# Patient Record
Sex: Male | Born: 1950 | Race: White | Hispanic: No | Marital: Married | Smoking: Former smoker
Health system: Southern US, Community
[De-identification: ages and names within clinical notes are randomized; demographics above are authoritative.]

## PROBLEM LIST (undated history)

## (undated) DIAGNOSIS — I10 Essential (primary) hypertension: Secondary | ICD-10-CM

---

## 2005-04-19 ENCOUNTER — Ambulatory Visit: Payer: Self-pay | Admitting: Family Medicine

## 2005-05-21 ENCOUNTER — Ambulatory Visit: Payer: Self-pay | Admitting: Family Medicine

## 2005-06-19 ENCOUNTER — Ambulatory Visit: Payer: Self-pay | Admitting: Family Medicine

## 2005-07-17 ENCOUNTER — Ambulatory Visit: Payer: Self-pay | Admitting: Family Medicine

## 2013-07-17 ENCOUNTER — Emergency Department (HOSPITAL_COMMUNITY)
Admission: EM | Admit: 2013-07-17 | Discharge: 2013-07-17 | Disposition: A | Discharge status: Home / Self Care | Payer: 59 | Attending: Emergency Medicine | Admitting: Emergency Medicine

## 2013-07-17 ENCOUNTER — Emergency Department (HOSPITAL_COMMUNITY): Payer: 59

## 2013-07-17 ENCOUNTER — Encounter (HOSPITAL_COMMUNITY): Payer: Self-pay | Admitting: Emergency Medicine

## 2013-07-17 DIAGNOSIS — R404 Transient alteration of awareness: Secondary | ICD-10-CM | POA: Insufficient documentation

## 2013-07-17 DIAGNOSIS — R911 Solitary pulmonary nodule: Secondary | ICD-10-CM

## 2013-07-17 DIAGNOSIS — I10 Essential (primary) hypertension: Secondary | ICD-10-CM | POA: Insufficient documentation

## 2013-07-17 DIAGNOSIS — R0989 Other specified symptoms and signs involving the circulatory and respiratory systems: Secondary | ICD-10-CM

## 2013-07-17 DIAGNOSIS — R6889 Other general symptoms and signs: Secondary | ICD-10-CM | POA: Insufficient documentation

## 2013-07-17 DIAGNOSIS — Z8546 Personal history of malignant neoplasm of prostate: Secondary | ICD-10-CM | POA: Insufficient documentation

## 2013-07-17 DIAGNOSIS — Z87891 Personal history of nicotine dependence: Secondary | ICD-10-CM | POA: Insufficient documentation

## 2013-07-17 DIAGNOSIS — Z7982 Long term (current) use of aspirin: Secondary | ICD-10-CM | POA: Insufficient documentation

## 2013-07-17 HISTORY — DX: Essential (primary) hypertension: I10

## 2013-07-17 NOTE — Discharge Instructions (Signed)
Please follow up with your primary care doctor to discuss your visit to the ER and to have your CT scan of your chest scheduled as an outpatient procedure for further evaluation of your pulmonary nodule. Please followup with the gastroenterologist in [place] to discuss your visit to the ER this evening as well and to discuss possible endoscopy for recurrent choking episodes. Please read all discharge instructions and return precautions.    Choking, Adult Choking occurs when a food or object gets stuck in the throat or trachea, blocking the airway. If the airway is partly blocked, coughing will usually cause the food or object to come out. If the airway is completely blocked, immediate action is needed to help it come out. A complete airway blockage is life-threatening because it causes breathing to stop. Choking is a true medical emergency that requires fast, appropriate action by anyone available. SIGNS OF AIRWAY BLOCKAGE There is a partial airway blockage if you or the person who is choking is:   Able to breathe and speak.  Coughing loudly.  Making loud noises. There is a complete airway blockage if you or the person who is choking is:   Unable to breathe.  Making soft or high-pitched sounds while breathing.  Unable to cough or coughing weakly, ineffectively, or silently.  Unable to cry, speak, or make sounds.  Turning blue.  Holding the neck with both arms. This is the universal sign of choking. WHAT TO DO IF CHOKING OCCURS If there is a partial airway blockage, allow coughing to clear the airway. Do not try to drink until the food or object comes out. If someone else has a partial airway blockage, do not interfere. Stay with him or her and watch for signs of complete airway blockage until the food or object comes out.  If there is a complete airway blockage or if there is a partial airway blockage and the food or object does not come out, perform abdominal thrusts (also referred to as  the Heimlich maneuver). Abdominal thrusts are used to create an artificial cough to try to clear the airway. Performing abdominal thrusts is part of a series of steps that should be done to help someone who is choking. Abdominal thrusts are usually done by someone else, but if you are alone, you can perform abdominal thrusts on yourself. Follow the procedure below that best fits your situation.  IF SOMEONE ELSE IS CHOKING: For a conscious adult:  1. Ask the person whether he or she is choking. If the person nods, continue to step 2. 2. Stand or kneel behind the person and lean him or her forward slightly. 3. Make a fist with 1 hand, put your arms around the person, and grasp your fist with your other hand. Place the thumb side of your fist in the person's abdomen, just below the ribs. 4. Press inward and upward with both hands. 5. Repeat this maneuver until the object comes out and the person is able to breathe or until the person loses consciousness. For an unconcious adult: 1. Shout for help. If someone responds, have him or her call local emergency services (911 in [place] If no one responds, call local emergency services yourself if possible. 2. Begin CPR, starting with compressions. Every time you open the airway to give rescue breaths, open the person's mouth. If you can see the food or object and it can be easily pulled out, remove it with your fingers. 3. After 5 cycles or 2 minutes of CPR,  call local emergency services (911 in [place] if you or someone else did not already call. For a conscious adult who is obese or in the later stages of pregnancy: Abdominal thrusts may not be effective when helping people who are in the later stages of pregnancy or who are obese. In these instances, chest thrusts can be used.  1. Ask the person whether he or she is choking. If the person nods and has signs of complete airway blockage, continue to step 2. 2. Stand behind the person and wrap your arms around his  or her chest (with your arms under the person's armpits). 3. Make a fist with 1 hand. Place the thumb side of your fist on the middle of the person's breastbone. 4. Grab your fist with your other hand and thrust backward. Continue this until the object comes out or until the person becomes unconscious. For an unconscious adult who is obese or in the later stages of pregnancy:  1. Shout for help. If someone responds, have him or her call local emergency services (911 in [place] If no one responds, call local emergency services yourself if possible. 2. Begin CPR, starting with compressions. Every time you open the airway to give rescue breaths, open the person's mouth. If you can see the food or object and it can be easily pulled out, remove it with your fingers. 3. After 5 cycles or 2 minutes of CPR, call local emergency services (911 in [place] if you or someone else did not already call. Note that abdominal thrusts (below the rib cage) should be used for a pregnant woman if possible. This should be possible until the later stages of pregnancy when there is no longer enough room between the enlarging uterus and the rib cage to perform the maneuver. At that point, chest thrusts must be used as described. IF YOU ARE CHOKING: 1. Call local emergency services (911 in [place] if near a landline. Do not worry about communicating what is happening. Do not hang up the phone. Someone may be sent to help you anyway. 2. Make a fist with 1 hand. Put the thumb side of the fist against your stomach, just above the belly button and well below the breastbone. If you are pregnant or obese, put your fist on your chest instead, just below the breastbone and just above your lowest ribs. 3. Hold your fist with your other hand and bend over a hard surface, such as a table or chair. 4. Forcefully push your fist in and up. 5. Continue to do this until the food or object comes out. PREVENTION  To be prepared if choking occurs,  learn how to correctly perform abdominal thrusts and give CPR by taking a certified first-aid training course.  SEEK IMMEDIATE MEDICAL CARE IF:  You have a fever after choking stops.  You have problems breathing after choking stops.  You received the Heimlich maneuver. MAKE SURE YOU:  Understand these instructions.  Watch your condition.  Get help right away if you are not doing well or get worse. Document Released: 05/24/2004 Document Revised: 01/09/2012 Document Reviewed: 11/27/2011 [facility] Patient Information 2014 [place]. Pulmonary Nodule A pulmonary nodule is a small, round growth of tissue in the lung. Pulmonary nodules can range in size from less than 1/5 inch (4 mm) to a little bigger than an inch (25 mm). Most pulmonary nodules are detected when imaging tests of the lung are being performed for a different problem. Pulmonary nodules are usually not cancerous (  benign). However, some pulmonary nodules are cancerous (malignant). Follow-up treatment or testing is based on the size of the pulmonary nodule and your risk of getting lung cancer.  CAUSES Benign pulmonary nodules can be caused by various things. Some of the causes include:   Bacterial, fungal, or viral infections. This is usually an old infection that is no longer active, but it can sometimes be a current, active infection.  A benign mass of tissue.  Inflammation from conditions such as rheumatoid arthritis.   Abnormal blood vessels in the lungs. Malignant pulmonary nodules can result from lung cancer or from cancers that spread to the lung from other places in the body. SIGNS AND SYMPTOMS Pulmonary nodules usually do not cause symptoms. DIAGNOSIS Most often, pulmonary nodules are found incidentally when an X-ray or CT scan is performed to look for some other problem in the lung area. To help determine whether a pulmonary nodule is benign or malignant, your health care provider will take a medical history  and order a variety of tests. Tests done may include:   Blood tests.  A skin test called a tuberculin test. This test is used to determine if you have been exposed to the germ that causes tuberculosis.   Chest X-rays. If possible, a new X-ray may be compared with X-rays you have had in the past.   CT scan. This test shows smaller pulmonary nodules more clearly than an X-ray.   Positron emission tomography (PET) scan. In this test, a safe amount of a radioactive substance is injected into the bloodstream. Then, the scan takes a picture of the pulmonary nodule. The radioactive substance is eliminated from your body in your urine.   Biopsy. A tiny piece of the pulmonary nodule is removed so it can be checked under a microscope. TREATMENT  Pulmonary nodules that are benign normally do not require any treatment because they usually do not cause symptoms or breathing problems. Your health care provider may want to monitor the pulmonary nodule through follow-up CT scans. The frequency of these CT scans will vary based on the size of the nodule and the risk factors for lung cancer. For example, CT scans will need to be done more frequently if the pulmonary nodule is larger and if you have a history of smoking and a family history of cancer. Further testing or biopsies may be done if any follow-up CT scan shows that the size of the pulmonary nodule has increased. HOME CARE INSTRUCTIONS  Only take over-the-counter or prescription medicines as directed by your health care provider.  Keep all follow-up appointments with your health care provider. SEEK MEDICAL CARE IF:  You have trouble breathing when you are active.   You feel sick or unusually tired.   You do not feel like eating.   You lose weight without trying to.   You develop chills or night sweats.  SEEK IMMEDIATE MEDICAL CARE IF:  You cannot catch your breath, or you begin wheezing.   You cannot stop coughing.   You cough  up blood.   You become dizzy or feel like you are going to pass out.   You have sudden chest pain.   You have a fever or persistent symptoms for more than 2 3 days.   You have a fever and your symptoms suddenly get worse. MAKE SURE YOU:  Understand these instructions.  Will watch your condition.  Will get help right away if you are not doing well or get worse. Document Released:  02/11/2009 Document Revised: 12/17/2012 Document Reviewed: 10/06/2012 [facility] Patient Information 2014 [place].

## 2013-07-17 NOTE — ED Provider Notes (Signed)
CSN: [id number]     Arrival date & time 07/17/13  1919 History   First MD Initiated Contact with Patient 07/17/13 2052     Chief Complaint  Patient presents with  . choked on food      (Consider location/radiation/quality/duration/timing/severity/associated sxs/prior Treatment) HPI Comments: Patient is a 63 year old male past medical history significant for hypertension, history of prostate cancer presenting to the emergency department after choking on beef tips PTA. The patient's wife states he lost consciousness, she attempted the Heimlich maneuver causing him to vomit up the beef tip. Patient states that he is having mild residual discomfort in the throat but denies any difficulty breathing, chest tightness, chest pain, wheezing, stridor. He states he still feels a little anxious and jittery about the episode but that is resolving the further removed he is from the incident. Patient states he is having history of food getting stuck in his throat and his wife have scheduled him to see a gastroenterologist in [place].   Past Medical History  Diagnosis Date  . Hypertension    History reviewed. No pertinent past surgical history. No family history on file. History  Substance Use Topics  . Smoking status: Former Research scientist (life sciences)  . Smokeless tobacco: Not on file  . Alcohol Use: Yes    Review of Systems  Respiratory: Positive for choking.   All other systems reviewed and are negative.      Allergies  Review of patient's allergies indicates no known allergies.  Home Medications   Current Outpatient Rx  Name  Route  Sig  Dispense  Refill  . aspirin 81 MG tablet   Oral   Take 81 mg by mouth at bedtime.         . docusate sodium (COLACE) 100 MG capsule   Oral   Take 100 mg by mouth at bedtime.          BP 165/88  Pulse 74  Temp(Src) 98.3 F (36.8 C) (Oral)  Resp 18  SpO2 94% Physical Exam  Nursing note and vitals reviewed. Constitutional: He is oriented to person, place,  and time. He appears well-developed and well-nourished. No distress.  HENT:  Head: Normocephalic and atraumatic.  Right Ear: External ear normal.  Left Ear: External ear normal.  Nose: Nose normal.  Mouth/Throat: Oropharynx is clear and moist. No oropharyngeal exudate.  Eyes: Conjunctivae are normal.  Neck: Normal range of motion. Neck supple. No tracheal deviation present.  Cardiovascular: Normal rate, regular rhythm and normal heart sounds.   Pulmonary/Chest: Effort normal and breath sounds normal. No stridor. No respiratory distress. He has no wheezes.  Abdominal: Soft. Bowel sounds are normal. There is no tenderness.  Musculoskeletal: Normal range of motion.  Lymphadenopathy:    He has no cervical adenopathy.  Neurological: He is alert and oriented to person, place, and time.  Skin: Skin is warm and dry. He is not diaphoretic.    ED Course  Procedures (including critical care time) Labs Review Labs Reviewed - No data to display Imaging Review Dg Neck Soft Tissue  07/17/2013   CLINICAL DATA:  Choking episode earlier tonight.  EXAM: NECK SOFT TISSUES - 1+ VIEW  COMPARISON:  None.  FINDINGS: Normal prevertebral soft tissues. Normal epiglottis. Calcified mass in the hypopharynx versus unusual or atypical calcification of the laryngeal cartilages. No opaque foreign bodies.  Severe disc space narrowing and endplate hypertrophic changes at C5-6 and C6-7. Slight cervical scoliosis convex right. Atherosclerotic calcification of the left carotid artery.  IMPRESSION: 1. Calcified  mass in the hypopharynx versus unusual/atypical calcification of the laryngeal cartilages. 2. Osseous findings in the cervical spine as described above.   Electronically Signed   By: Evangeline Dakin M.D.   On: 07/17/2013 22:23   Dg Chest 2 View  07/17/2013   CLINICAL DATA:  Choking episode  EXAM: CHEST  2 VIEW  COMPARISON:  None.  FINDINGS: Normal cardiac silhouette. Patient rotated rightward. No effusion, infiltrate,  or pneumothorax. Mild central venous pulmonary congestion. Degenerative osteophytosis of the thoracic spine.  9 mm density projecting over the left upper lobe (sixth rib).  IMPRESSION: 1. Central venous pulmonary congestion.  No acute findings.  2. Potential left upper lobe pulmonary nodule. Recommend non emergent CT thorax without contrast.   Electronically Signed   By: Suzy Bouchard M.D.   On: 07/17/2013 20:58     EKG Interpretation None      MDM   Final diagnoses:  Choking episode  Pulmonary nodule seen on imaging study    Filed Vitals:   07/17/13 2200  BP: 165/88  Pulse: 74  Temp:   Resp:     Afebrile, NAD, non-toxic appearing, AAOx4.   1) Choking: patient presenting after choking on a beef tip earlier this evening. Food was successfully dislodged with the Hemlich maneuver. Patient has no shortness of breath, chest pain, wheezing, stridor complaints. Oropharynx is clear. No tracheal deviation. Lungs clear to auscultation. DG soft tissue obtained. Results discussed with patient.   2) Lung nodule: discussed chest x-ray finding with the patient. Advised to hold it CT scan of his chest as an outpatient for follow up. Patient is agreeable to this plan and has adequate follow up to obtain this CT scan.   Return precautions discussed. Patient is agreeable to plan. Patient stable at time of discharge.   Patient d/w with Dr. Tawnya Crook, agrees with plan.      Harlow Mares, PA-C 07/17/13 2346

## 2013-07-17 NOTE — ED Notes (Signed)
The pt was eating beef tips and he felt  The beef  Was choking him.  He lost consciousness and his wife attempted the hemlich maneuver and he began to vomit.  He reports that he ocassionally gets food hung in his throat.  No pain .  Alert talking  .  He feels uneasy just wants to be checked

## 2013-07-17 NOTE — ED Notes (Signed)
Patient transported to X-ray 

## 2013-07-18 NOTE — ED Provider Notes (Signed)
Medical screening examination/treatment/procedure(s) were performed by non-physician practitioner and as supervising physician I was immediately available for consultation/collaboration.   EKG Interpretation None        Megan E Docherty, MD 07/18/13 1134 

## 2015-01-14 IMAGING — CR DG CHEST 2V
2 series · 2 of 2 positions shown · non-contrast
Comparison: None.

CLINICAL DATA: Choking episode

EXAM:
CHEST  2 VIEW

[w chest pa]
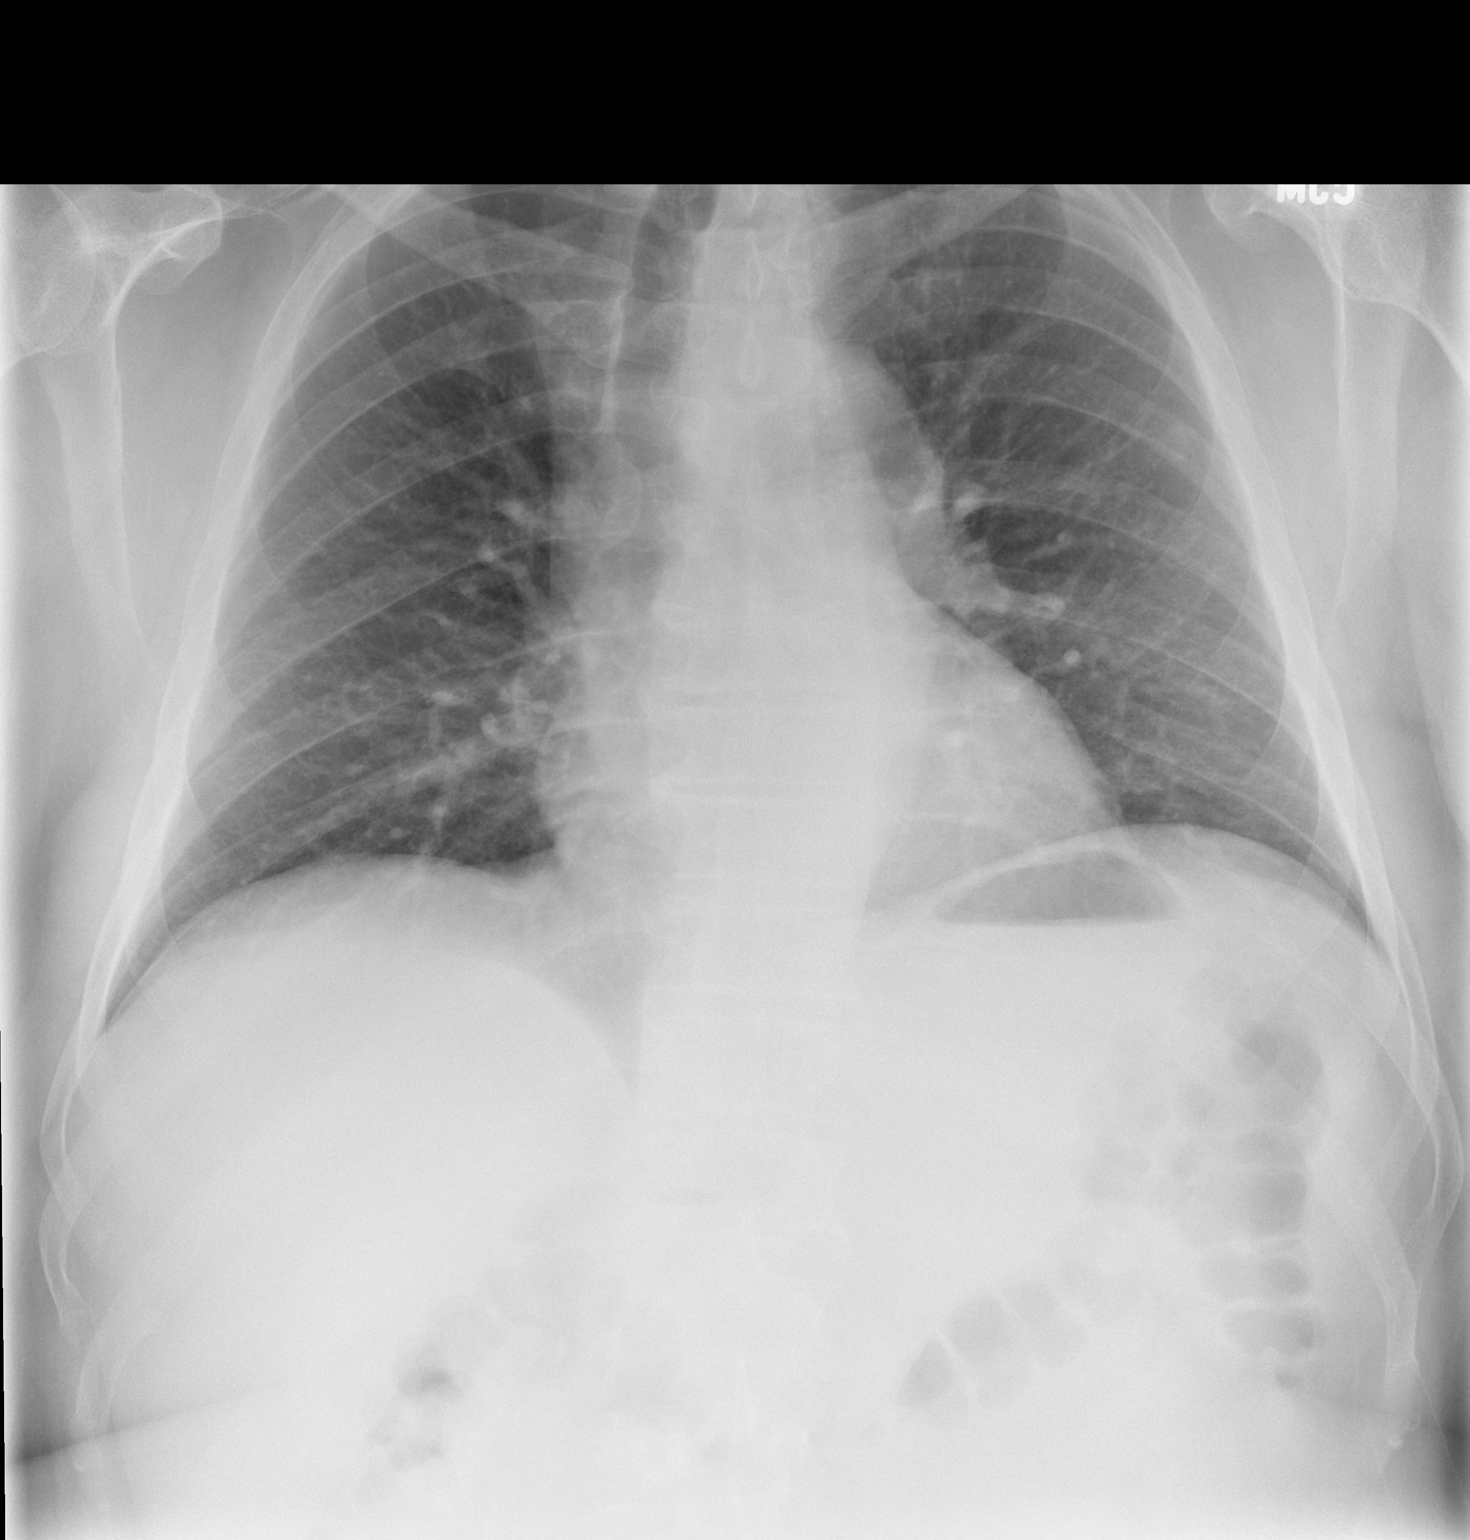

[w chest lat]
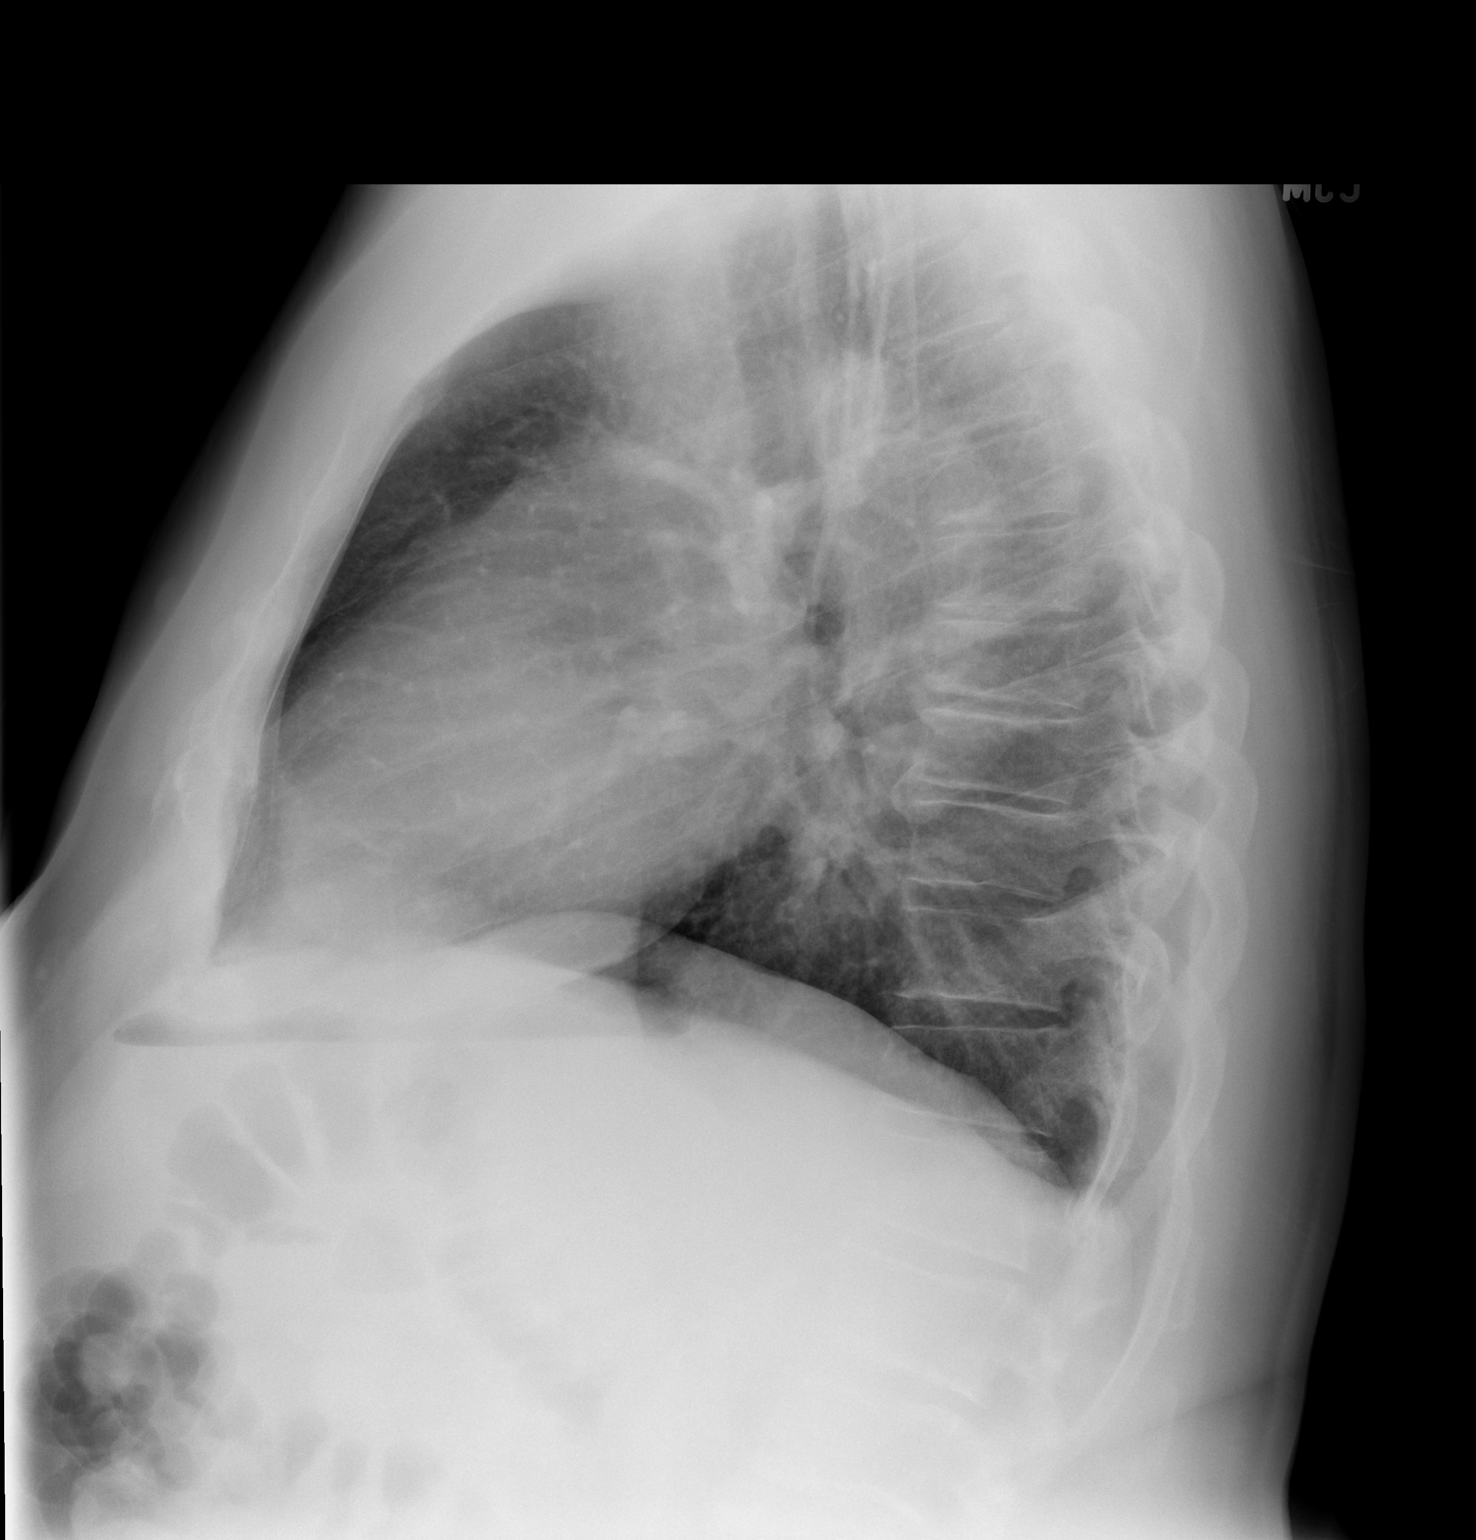

[2 of 2 positions shown; findings below may reference images not displayed]

FINDINGS: Normal cardiac silhouette. Patient rotated rightward. No effusion,
infiltrate, or pneumothorax. Mild central venous pulmonary
congestion. Degenerative osteophytosis of the thoracic spine.

9 mm density projecting over the left upper lobe (sixth rib).
IMPRESSION: 1. Central venous pulmonary congestion.  No acute findings.

2. Potential left upper lobe pulmonary nodule. Recommend non
emergent CT thorax without contrast.

## 2016-11-01 DIAGNOSIS — H2513 Age-related nuclear cataract, bilateral: Secondary | ICD-10-CM | POA: Diagnosis not present

## 2016-11-01 DIAGNOSIS — H524 Presbyopia: Secondary | ICD-10-CM | POA: Diagnosis not present

## 2016-11-01 DIAGNOSIS — H17821 Peripheral opacity of cornea, right eye: Secondary | ICD-10-CM | POA: Diagnosis not present

## 2016-12-25 DIAGNOSIS — E782 Mixed hyperlipidemia: Secondary | ICD-10-CM | POA: Diagnosis not present

## 2016-12-25 DIAGNOSIS — E1165 Type 2 diabetes mellitus with hyperglycemia: Secondary | ICD-10-CM | POA: Diagnosis not present

## 2016-12-28 DIAGNOSIS — E039 Hypothyroidism, unspecified: Secondary | ICD-10-CM | POA: Diagnosis not present

## 2016-12-28 DIAGNOSIS — I10 Essential (primary) hypertension: Secondary | ICD-10-CM | POA: Diagnosis not present

## 2016-12-28 DIAGNOSIS — E782 Mixed hyperlipidemia: Secondary | ICD-10-CM | POA: Diagnosis not present

## 2016-12-28 DIAGNOSIS — E1165 Type 2 diabetes mellitus with hyperglycemia: Secondary | ICD-10-CM | POA: Diagnosis not present

## 2017-04-02 DIAGNOSIS — Z86010 Personal history of colonic polyps: Secondary | ICD-10-CM | POA: Diagnosis not present

## 2017-04-02 DIAGNOSIS — Z1211 Encounter for screening for malignant neoplasm of colon: Secondary | ICD-10-CM | POA: Diagnosis not present

## 2017-04-17 DIAGNOSIS — Z800 Family history of malignant neoplasm of digestive organs: Secondary | ICD-10-CM | POA: Diagnosis not present

## 2017-04-17 DIAGNOSIS — Z86010 Personal history of colonic polyps: Secondary | ICD-10-CM | POA: Diagnosis not present

## 2017-04-17 DIAGNOSIS — I10 Essential (primary) hypertension: Secondary | ICD-10-CM | POA: Diagnosis not present

## 2017-04-17 DIAGNOSIS — D125 Benign neoplasm of sigmoid colon: Secondary | ICD-10-CM | POA: Diagnosis not present

## 2017-04-17 DIAGNOSIS — D122 Benign neoplasm of ascending colon: Secondary | ICD-10-CM | POA: Diagnosis not present

## 2017-04-17 DIAGNOSIS — Z1211 Encounter for screening for malignant neoplasm of colon: Secondary | ICD-10-CM | POA: Diagnosis not present

## 2017-04-17 DIAGNOSIS — K635 Polyp of colon: Secondary | ICD-10-CM | POA: Diagnosis not present

## 2017-04-17 DIAGNOSIS — E039 Hypothyroidism, unspecified: Secondary | ICD-10-CM | POA: Diagnosis not present

## 2017-04-17 DIAGNOSIS — K648 Other hemorrhoids: Secondary | ICD-10-CM | POA: Diagnosis not present

## 2017-04-17 DIAGNOSIS — Z8546 Personal history of malignant neoplasm of prostate: Secondary | ICD-10-CM | POA: Diagnosis not present

## 2017-04-17 DIAGNOSIS — K5730 Diverticulosis of large intestine without perforation or abscess without bleeding: Secondary | ICD-10-CM | POA: Diagnosis not present

## 2017-04-17 DIAGNOSIS — Z79899 Other long term (current) drug therapy: Secondary | ICD-10-CM | POA: Diagnosis not present

## 2017-04-17 DIAGNOSIS — Z7982 Long term (current) use of aspirin: Secondary | ICD-10-CM | POA: Diagnosis not present

## 2017-04-17 DIAGNOSIS — K219 Gastro-esophageal reflux disease without esophagitis: Secondary | ICD-10-CM | POA: Diagnosis not present

## 2017-10-21 DIAGNOSIS — Z0000 Encounter for general adult medical examination without abnormal findings: Secondary | ICD-10-CM | POA: Diagnosis not present

## 2017-10-21 DIAGNOSIS — Z23 Encounter for immunization: Secondary | ICD-10-CM | POA: Diagnosis not present

## 2018-02-26 DIAGNOSIS — M722 Plantar fascial fibromatosis: Secondary | ICD-10-CM

## 2018-10-20 DIAGNOSIS — Z125 Encounter for screening for malignant neoplasm of prostate: Secondary | ICD-10-CM | POA: Diagnosis not present

## 2018-10-20 DIAGNOSIS — E119 Type 2 diabetes mellitus without complications: Secondary | ICD-10-CM | POA: Diagnosis not present

## 2018-10-20 DIAGNOSIS — E782 Mixed hyperlipidemia: Secondary | ICD-10-CM | POA: Diagnosis not present

## 2018-10-23 DIAGNOSIS — I10 Essential (primary) hypertension: Secondary | ICD-10-CM | POA: Diagnosis not present

## 2018-10-23 DIAGNOSIS — C61 Malignant neoplasm of prostate: Secondary | ICD-10-CM | POA: Diagnosis not present

## 2018-10-23 DIAGNOSIS — E785 Hyperlipidemia, unspecified: Secondary | ICD-10-CM | POA: Diagnosis not present

## 2018-10-23 DIAGNOSIS — E039 Hypothyroidism, unspecified: Secondary | ICD-10-CM | POA: Diagnosis not present

## 2018-10-23 DIAGNOSIS — E669 Obesity, unspecified: Secondary | ICD-10-CM | POA: Diagnosis not present

## 2018-10-23 DIAGNOSIS — K219 Gastro-esophageal reflux disease without esophagitis: Secondary | ICD-10-CM | POA: Diagnosis not present

## 2018-10-23 DIAGNOSIS — Z0000 Encounter for general adult medical examination without abnormal findings: Secondary | ICD-10-CM | POA: Diagnosis not present

## 2018-10-23 DIAGNOSIS — Z23 Encounter for immunization: Secondary | ICD-10-CM | POA: Diagnosis not present

## 2018-10-23 DIAGNOSIS — Z1211 Encounter for screening for malignant neoplasm of colon: Secondary | ICD-10-CM | POA: Diagnosis not present

## 2018-10-23 DIAGNOSIS — E119 Type 2 diabetes mellitus without complications: Secondary | ICD-10-CM | POA: Diagnosis not present

## 2019-01-26 DIAGNOSIS — E119 Type 2 diabetes mellitus without complications: Secondary | ICD-10-CM | POA: Diagnosis not present

## 2019-03-12 DIAGNOSIS — H5203 Hypermetropia, bilateral: Secondary | ICD-10-CM | POA: Diagnosis not present

## 2019-03-12 DIAGNOSIS — H2513 Age-related nuclear cataract, bilateral: Secondary | ICD-10-CM | POA: Diagnosis not present

## 2019-03-12 DIAGNOSIS — E119 Type 2 diabetes mellitus without complications: Secondary | ICD-10-CM | POA: Diagnosis not present

## 2019-04-28 DIAGNOSIS — E119 Type 2 diabetes mellitus without complications: Secondary | ICD-10-CM | POA: Diagnosis not present

## 2019-04-28 DIAGNOSIS — N289 Disorder of kidney and ureter, unspecified: Secondary | ICD-10-CM | POA: Diagnosis not present

## 2019-04-28 DIAGNOSIS — E782 Mixed hyperlipidemia: Secondary | ICD-10-CM | POA: Diagnosis not present

## 2019-04-28 DIAGNOSIS — K219 Gastro-esophageal reflux disease without esophagitis: Secondary | ICD-10-CM | POA: Diagnosis not present

## 2019-04-28 DIAGNOSIS — E039 Hypothyroidism, unspecified: Secondary | ICD-10-CM | POA: Diagnosis not present

## 2019-04-28 DIAGNOSIS — I10 Essential (primary) hypertension: Secondary | ICD-10-CM | POA: Diagnosis not present

## 2019-10-27 DIAGNOSIS — Z0000 Encounter for general adult medical examination without abnormal findings: Secondary | ICD-10-CM | POA: Diagnosis not present

## 2019-10-27 DIAGNOSIS — E782 Mixed hyperlipidemia: Secondary | ICD-10-CM | POA: Diagnosis not present

## 2019-10-27 DIAGNOSIS — I10 Essential (primary) hypertension: Secondary | ICD-10-CM | POA: Diagnosis not present

## 2019-10-27 DIAGNOSIS — K219 Gastro-esophageal reflux disease without esophagitis: Secondary | ICD-10-CM | POA: Diagnosis not present

## 2019-10-27 DIAGNOSIS — Z7984 Long term (current) use of oral hypoglycemic drugs: Secondary | ICD-10-CM | POA: Diagnosis not present

## 2019-10-27 DIAGNOSIS — E039 Hypothyroidism, unspecified: Secondary | ICD-10-CM | POA: Diagnosis not present

## 2019-10-27 DIAGNOSIS — C61 Malignant neoplasm of prostate: Secondary | ICD-10-CM | POA: Diagnosis not present

## 2019-10-27 DIAGNOSIS — E119 Type 2 diabetes mellitus without complications: Secondary | ICD-10-CM | POA: Diagnosis not present

## 2020-04-28 DIAGNOSIS — E119 Type 2 diabetes mellitus without complications: Secondary | ICD-10-CM | POA: Diagnosis not present

## 2020-04-28 DIAGNOSIS — C61 Malignant neoplasm of prostate: Secondary | ICD-10-CM | POA: Diagnosis not present

## 2020-04-28 DIAGNOSIS — E782 Mixed hyperlipidemia: Secondary | ICD-10-CM | POA: Diagnosis not present

## 2020-04-28 DIAGNOSIS — Z23 Encounter for immunization: Secondary | ICD-10-CM | POA: Diagnosis not present

## 2020-04-28 DIAGNOSIS — I10 Essential (primary) hypertension: Secondary | ICD-10-CM | POA: Diagnosis not present

## 2020-04-28 DIAGNOSIS — K219 Gastro-esophageal reflux disease without esophagitis: Secondary | ICD-10-CM | POA: Diagnosis not present

## 2020-06-21 DIAGNOSIS — C61 Malignant neoplasm of prostate: Secondary | ICD-10-CM | POA: Diagnosis not present

## 2020-06-21 DIAGNOSIS — N9989 Other postprocedural complications and disorders of genitourinary system: Secondary | ICD-10-CM | POA: Diagnosis not present

## 2020-10-27 DIAGNOSIS — E119 Type 2 diabetes mellitus without complications: Secondary | ICD-10-CM | POA: Diagnosis not present

## 2020-10-27 DIAGNOSIS — Z7984 Long term (current) use of oral hypoglycemic drugs: Secondary | ICD-10-CM | POA: Diagnosis not present

## 2020-10-27 DIAGNOSIS — Z0000 Encounter for general adult medical examination without abnormal findings: Secondary | ICD-10-CM | POA: Diagnosis not present

## 2020-10-27 DIAGNOSIS — E782 Mixed hyperlipidemia: Secondary | ICD-10-CM | POA: Diagnosis not present

## 2020-10-27 DIAGNOSIS — K219 Gastro-esophageal reflux disease without esophagitis: Secondary | ICD-10-CM | POA: Diagnosis not present

## 2020-10-27 DIAGNOSIS — I10 Essential (primary) hypertension: Secondary | ICD-10-CM | POA: Diagnosis not present

## 2020-10-27 DIAGNOSIS — E039 Hypothyroidism, unspecified: Secondary | ICD-10-CM | POA: Diagnosis not present

## 2020-12-19 DIAGNOSIS — C61 Malignant neoplasm of prostate: Secondary | ICD-10-CM | POA: Diagnosis not present

## 2020-12-19 DIAGNOSIS — N9989 Other postprocedural complications and disorders of genitourinary system: Secondary | ICD-10-CM | POA: Diagnosis not present

## 2021-01-10 DIAGNOSIS — U071 COVID-19: Secondary | ICD-10-CM | POA: Diagnosis not present

## 2021-02-01 DIAGNOSIS — L814 Other melanin hyperpigmentation: Secondary | ICD-10-CM | POA: Diagnosis not present

## 2021-02-01 DIAGNOSIS — D225 Melanocytic nevi of trunk: Secondary | ICD-10-CM | POA: Diagnosis not present

## 2021-02-01 DIAGNOSIS — L570 Actinic keratosis: Secondary | ICD-10-CM | POA: Diagnosis not present

## 2021-02-01 DIAGNOSIS — D1801 Hemangioma of skin and subcutaneous tissue: Secondary | ICD-10-CM | POA: Diagnosis not present

## 2021-02-01 DIAGNOSIS — L578 Other skin changes due to chronic exposure to nonionizing radiation: Secondary | ICD-10-CM | POA: Diagnosis not present

## 2022-06-22 ENCOUNTER — Encounter: Payer: Self-pay | Admitting: Gastroenterology
